# Patient Record
Sex: Male | Born: 1945 | Race: Black or African American | Hispanic: No | Marital: Married | Smoking: Current every day smoker
Health system: Southern US, Community
[De-identification: ages and names within clinical notes are randomized; demographics above are authoritative.]

## PROBLEM LIST (undated history)

## (undated) DIAGNOSIS — R972 Elevated prostate specific antigen [PSA]: Secondary | ICD-10-CM

## (undated) DIAGNOSIS — A048 Other specified bacterial intestinal infections: Secondary | ICD-10-CM

## (undated) DIAGNOSIS — N400 Enlarged prostate without lower urinary tract symptoms: Secondary | ICD-10-CM

## (undated) DIAGNOSIS — R51 Headache: Secondary | ICD-10-CM

## (undated) DIAGNOSIS — Z9889 Other specified postprocedural states: Secondary | ICD-10-CM

## (undated) DIAGNOSIS — I10 Essential (primary) hypertension: Secondary | ICD-10-CM

## (undated) DIAGNOSIS — K219 Gastro-esophageal reflux disease without esophagitis: Secondary | ICD-10-CM

## (undated) DIAGNOSIS — J449 Chronic obstructive pulmonary disease, unspecified: Secondary | ICD-10-CM

## (undated) DIAGNOSIS — R519 Headache, unspecified: Secondary | ICD-10-CM

## (undated) DIAGNOSIS — M7150 Other bursitis, not elsewhere classified, unspecified site: Secondary | ICD-10-CM

## (undated) DIAGNOSIS — E079 Disorder of thyroid, unspecified: Secondary | ICD-10-CM

## (undated) HISTORY — DX: Chronic obstructive pulmonary disease, unspecified: J44.9

## (undated) HISTORY — DX: Other bursitis, not elsewhere classified, unspecified site: M71.50

## (undated) HISTORY — DX: Elevated prostate specific antigen (PSA): R97.20

## (undated) HISTORY — DX: Other specified postprocedural states: Z98.890

## (undated) HISTORY — DX: Gastro-esophageal reflux disease without esophagitis: K21.9

## (undated) HISTORY — DX: Essential (primary) hypertension: I10

## (undated) HISTORY — DX: Headache, unspecified: R51.9

## (undated) HISTORY — DX: Disorder of thyroid, unspecified: E07.9

## (undated) HISTORY — DX: Benign prostatic hyperplasia without lower urinary tract symptoms: N40.0

## (undated) HISTORY — DX: Other specified bacterial intestinal infections: A04.8

## (undated) HISTORY — DX: Headache: R51

## (undated) HISTORY — PX: HERNIA REPAIR: SHX51

---

## 2001-09-15 LAB — HM SIGMOIDOSCOPY

## 2003-06-29 ENCOUNTER — Encounter: Admission: RE | Admit: 2003-06-29 | Discharge: 2003-06-29 | Payer: Self-pay | Admitting: Family Medicine

## 2003-06-29 ENCOUNTER — Encounter: Payer: Self-pay | Admitting: Family Medicine

## 2004-10-10 ENCOUNTER — Ambulatory Visit: Payer: Self-pay | Admitting: Family Medicine

## 2004-10-17 ENCOUNTER — Ambulatory Visit: Payer: Self-pay | Admitting: Family Medicine

## 2004-11-06 ENCOUNTER — Encounter: Admission: RE | Admit: 2004-11-06 | Discharge: 2004-11-06 | Payer: Self-pay | Admitting: Family Medicine

## 2004-11-14 ENCOUNTER — Ambulatory Visit: Payer: Self-pay | Admitting: Family Medicine

## 2005-11-21 ENCOUNTER — Ambulatory Visit: Payer: Self-pay | Admitting: Family Medicine

## 2005-12-10 IMAGING — CR DG CHEST 2V
2 series · 2 of 2 positions shown · non-contrast
Comparison: 06/29/03.

CLINICAL DATA: COPD/smoker. 
 CHEST - TWO VIEW:

[view not recorded (1 of 2)]
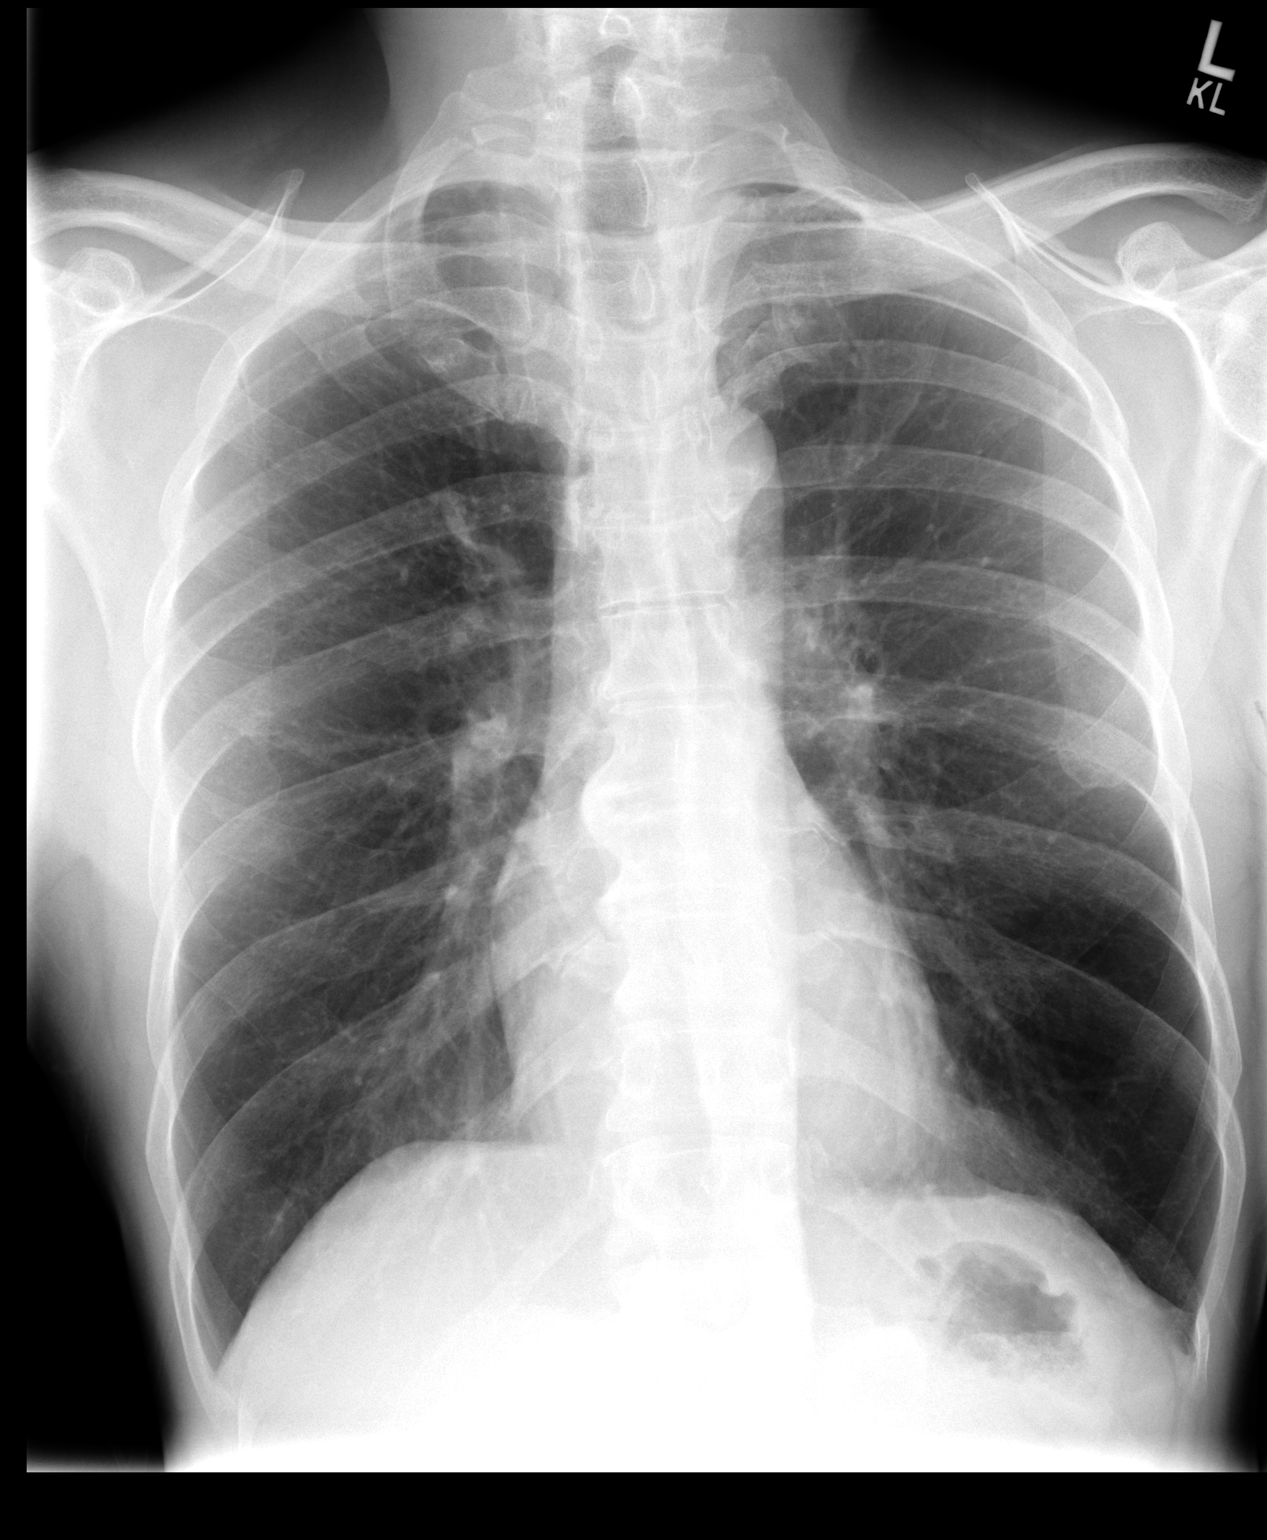

[view not recorded (2 of 2)]
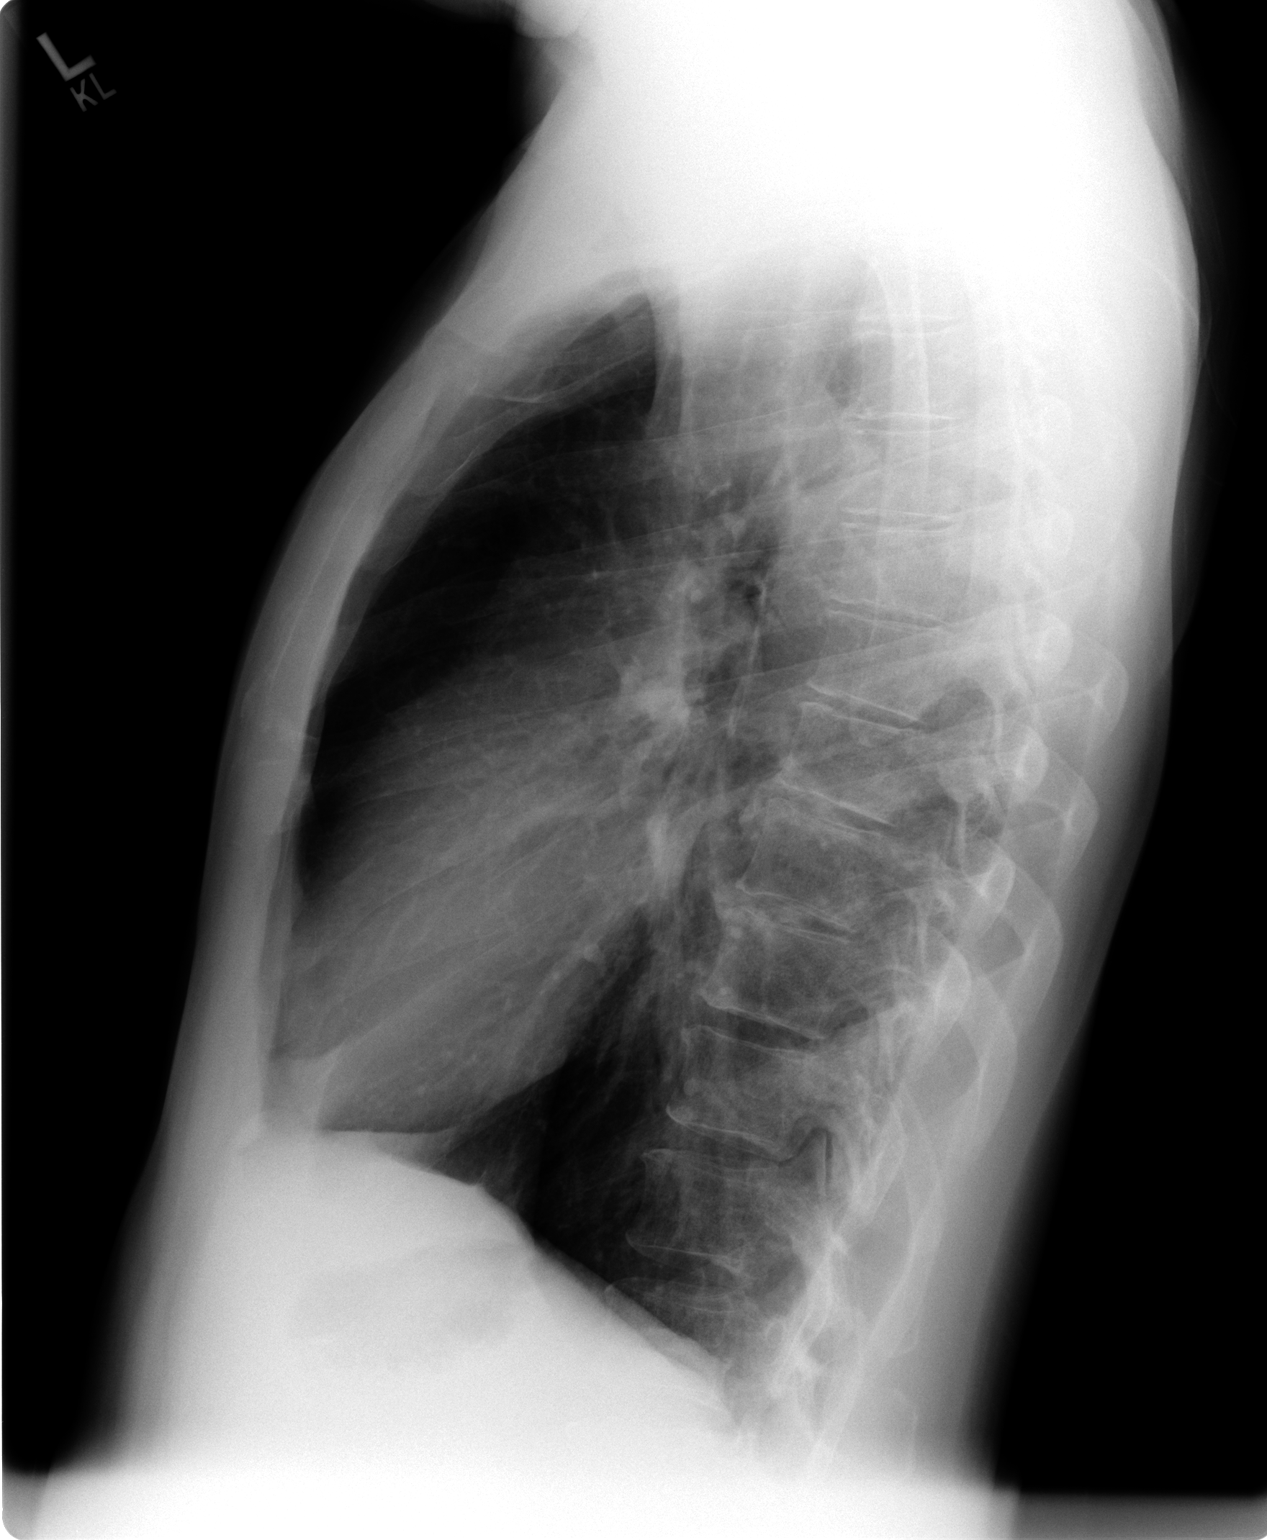

[2 of 2 positions shown; findings below may reference images not displayed]

FINDINGS: Moderate COPD.  No acute process.  No pleural fluid or osseous lesions.
IMPRESSION: COPD ? no active disease.

## 2006-01-27 ENCOUNTER — Ambulatory Visit: Payer: Self-pay | Admitting: Family Medicine

## 2006-02-03 ENCOUNTER — Ambulatory Visit: Payer: Self-pay | Admitting: Family Medicine

## 2007-04-07 DIAGNOSIS — J449 Chronic obstructive pulmonary disease, unspecified: Secondary | ICD-10-CM

## 2007-04-07 DIAGNOSIS — I10 Essential (primary) hypertension: Secondary | ICD-10-CM | POA: Insufficient documentation

## 2007-04-07 DIAGNOSIS — J4489 Other specified chronic obstructive pulmonary disease: Secondary | ICD-10-CM | POA: Insufficient documentation

## 2007-04-07 DIAGNOSIS — N400 Enlarged prostate without lower urinary tract symptoms: Secondary | ICD-10-CM

## 2012-03-26 ENCOUNTER — Ambulatory Visit (HOSPITAL_BASED_OUTPATIENT_CLINIC_OR_DEPARTMENT_OTHER)
Admission: RE | Admit: 2012-03-26 | Discharge: 2012-03-26 | Disposition: A | Discharge status: Home / Self Care | Payer: 59 | Source: Ambulatory Visit | Attending: Family Medicine | Admitting: Family Medicine

## 2012-03-26 ENCOUNTER — Other Ambulatory Visit (HOSPITAL_BASED_OUTPATIENT_CLINIC_OR_DEPARTMENT_OTHER): Payer: Self-pay | Admitting: Family Medicine

## 2012-03-26 DIAGNOSIS — E785 Hyperlipidemia, unspecified: Secondary | ICD-10-CM

## 2012-03-26 DIAGNOSIS — E559 Vitamin D deficiency, unspecified: Secondary | ICD-10-CM

## 2012-03-26 DIAGNOSIS — M2559 Pain in other specified joint: Secondary | ICD-10-CM

## 2012-03-26 DIAGNOSIS — R5381 Other malaise: Secondary | ICD-10-CM

## 2012-03-26 DIAGNOSIS — I10 Essential (primary) hypertension: Secondary | ICD-10-CM | POA: Insufficient documentation

## 2012-03-26 DIAGNOSIS — K219 Gastro-esophageal reflux disease without esophagitis: Secondary | ICD-10-CM

## 2012-03-26 DIAGNOSIS — R5383 Other fatigue: Secondary | ICD-10-CM

## 2012-03-26 DIAGNOSIS — R069 Unspecified abnormalities of breathing: Secondary | ICD-10-CM | POA: Insufficient documentation

## 2012-03-26 DIAGNOSIS — F17200 Nicotine dependence, unspecified, uncomplicated: Secondary | ICD-10-CM

## 2013-03-30 ENCOUNTER — Ambulatory Visit: Payer: 59 | Admitting: Family Medicine

## 2013-03-30 ENCOUNTER — Telehealth: Payer: Self-pay

## 2013-03-30 ENCOUNTER — Ambulatory Visit (INDEPENDENT_AMBULATORY_CARE_PROVIDER_SITE_OTHER): Payer: 59 | Admitting: Family Medicine

## 2013-03-30 VITALS — BP 149/75 | HR 92 | Wt 134.0 lb

## 2013-03-30 DIAGNOSIS — E0590 Thyrotoxicosis, unspecified without thyrotoxic crisis or storm: Secondary | ICD-10-CM

## 2013-03-30 DIAGNOSIS — I10 Essential (primary) hypertension: Secondary | ICD-10-CM

## 2013-03-30 NOTE — Telephone Encounter (Signed)
Antonio Jefferson came back into the office @ 11:57 today for feedback on his appointment with [facility].  I informed him of the dates and time below and I also gave him the number again to call if anything changes. The patient verbalized understanding and the importance of this test. He did not have any other questions at this time. LB

## 2013-03-30 NOTE — Progress Notes (Signed)
  Subjective:    Patient ID: Antonio Jefferson, male    DOB: [birth year], 67 y.o.   MRN: [id number]  HPI  Antonio Jefferson is here today to follow up on his abnormal thyroid labs.  He recently saw his urologist (He recently saw his Urologist, Dr. Marcelyn Bruins and he was told that his thyroid levels were concerning.  He was instructed to follow up with our office.    Review of Systems  Constitutional:       Weight loss    Past Medical History  Diagnosis Date  . Esophageal reflux   . Helicobacter pylori (H. pylori)   . Unspecified disorder of thyroid   . Other bursitis disorders   . Essential hypertension, benign   . BPH (benign prostatic hypertrophy)   . COPD (chronic obstructive pulmonary disease)   . Headache   . Elevated PSA   . History of prostate biopsy     Family History  Problem Relation Age of Onset  . Diabetes Mother   . Hypertension Mother   . Cancer Father     Prostate Cancer    History   Social History Narrative   Marital Status: Married Data processing manager)    Children:  Daughters  (3) (Terri, [place], Nia) Son (1)  Isidoro Donning)    Pets: Dogs (2) Turtle (1) Charity fundraiser (1)    Living Situation: Lives with wife    Occupation: Clinical biochemist Administrator)     Education: Engineer, maintenance (IT) (Biology)     Tobacco Use/Exposure:  Current Smoker - 3/4 ppd for 30 years.    Alcohol Use:  Beer (quart) daily    Drug Use:  None   Diet:  Regular   Exercise:  Walking at work    Hobbies: Fishing                 Objective:   Physical Exam  Constitutional: He appears well-nourished. No distress.  HENT:  Head: Normocephalic.  Nose: Nose normal.  Mouth/Throat: Oropharynx is clear and moist.  Eyes: Conjunctivae are normal. No scleral icterus.  Neck: Neck supple. No thyromegaly present.  Cardiovascular: Normal rate, regular rhythm and normal heart sounds.   Pulmonary/Chest: Effort normal and breath sounds normal.  Abdominal: Soft. He exhibits no mass. There is no tenderness.  Musculoskeletal: Normal  range of motion.  Lymphadenopathy:    He has no cervical adenopathy.  Neurological: He is alert.  Skin: Skin is warm and dry. No rash noted.  Psychiatric: He has a normal mood and affect. His behavior is normal. Judgment and thought content normal.          Assessment & Plan:

## 2013-03-30 NOTE — Telephone Encounter (Signed)
A call was placed to the insurance company for prior authorization. They stated that there was no need for a prior authorization with this procedure.   I called [facility] to schedule the appointment for Antonio Jefferson.  He is scheduled for Monday and Tuesday 7/21 and 7/22 at 9:00 am.  The patient has been notified via voice mail, that this is a two day process and if he's not able to keep that appointment he can call them at [phone] to reschedule. LB

## 2013-04-06 ENCOUNTER — Encounter: Payer: Self-pay | Admitting: Family Medicine

## 2013-04-06 ENCOUNTER — Ambulatory Visit (INDEPENDENT_AMBULATORY_CARE_PROVIDER_SITE_OTHER): Payer: 59 | Admitting: Family Medicine

## 2013-04-06 VITALS — BP 143/78 | HR 102 | Resp 16 | Ht 68.0 in | Wt 134.0 lb

## 2013-04-06 DIAGNOSIS — I10 Essential (primary) hypertension: Secondary | ICD-10-CM

## 2013-04-06 DIAGNOSIS — E0590 Thyrotoxicosis, unspecified without thyrotoxic crisis or storm: Secondary | ICD-10-CM

## 2013-04-06 MED ORDER — PROPRANOLOL HCL 20 MG PO TABS: 20.0000 mg | ORAL_TABLET | Freq: Two times a day (BID) | ORAL | Status: DC

## 2013-04-06 NOTE — Patient Instructions (Addendum)
1)  Tachycardia (102) /Hypertension - Start on the propranolol 20 mg at night for 4 days then increase to 1 tab twice a day.  You can stay on this until your procedure or longer if needed to keep your BP under better control.   2)  Hyperthyroidism - Let us know what you decide. We can get you to an endocrinologist if you want to discuss your options or can send you for the radioactive iodine treatment.      Hyperthyroidism The thyroid is a large gland located in the lower front part of your neck. The thyroid helps control metabolism. Metabolism is how your body uses food. It controls metabolism with the hormone thyroxine. When the thyroid is overactive, it produces too much hormone. When this happens, these following problems may occur:   Nervousness  Heat intolerance  Weight loss (in spite of increase food intake)  Diarrhea  Change in hair or skin texture  Palpitations (heart skipping or having extra beats)  Tachycardia (rapid heart rate)  Loss of menstruation (amenorrhea)  Shaking of the hands CAUSES  Grave's Disease (the immune system attacks the thyroid gland). This is the most common cause.  Inflammation of the thyroid gland.  Tumor (usually benign) in the thyroid gland or elsewhere.  Excessive use of thyroid medications (both prescription and 'natural').  Excessive ingestion of Iodine. DIAGNOSIS  To prove hyperthyroidism, your caregiver may do blood tests and ultrasound tests. Sometimes the signs are hidden. It may be necessary for your caregiver to watch this illness with blood tests, either before or after diagnosis and treatment. TREATMENT Short-term treatment There are several treatments to control symptoms. Drugs called beta blockers may give some relief. Drugs that decrease hormone production will provide temporary relief in many people. These measures will usually not give permanent relief. Definitive therapy There are treatments available which can be  discussed between you and your caregiver which will permanently treat the problem. These treatments range from surgery (removal of the thyroid), to the use of radioactive iodine (destroys the thyroid by radiation), to the use of antithyroid drugs (interfere with hormone synthesis). The first two treatments are permanent and usually successful. They most often require hormone replacement therapy for life. This is because it is impossible to remove or destroy the exact amount of thyroid required to make a person euthyroid (normal). HOME CARE INSTRUCTIONS  See your caregiver if the problems you are being treated for get worse. Examples of this would be the problems listed above. SEEK MEDICAL CARE IF: Your general condition worsens. MAKE SURE YOU:   Understand these instructions.  Will watch your condition.  Will get help right away if you are not doing well or get worse. Document Released: 09/01/2005 Document Revised: 11/24/2011 Document Reviewed: 01/13/2007 [facility] Patient Information 2014 [place], [place].  Radioiodine (I-131) Therapy for Hyperthyroidism Radioiodine (I-131) therapy for hyperthyroidism is a procedure used to treat an overactive thyroid (hyperthyroidism). The patient swallows I-131, which is a radioactive form of iodine. The I-131 destroys thyroid cells. The thyroid is a gland in the neck. It makes thyroid hormones, which control how cells throughout the body use energy. Hyperthyroidism is usually caused by Grave's disease or by growths within the thyroid (nodules). Symptoms may include:  Nervousness.  Irritability.  Problems with sleep.  Tiredness.  Fast heart rate.  Shaky hands.  Sweating.  Heat sensitivity.  Unintended weight loss.  Brittle hair.  Enlarged thyroid gland.  Menstrual changes.  Frequent stools. LET YOUR CAREGIVER KNOW ABOUT:  All allergies.  All medications that you are taking, including over-the-counter and prescription drugs, dietary  supplements, vitamins, or herbal preparations.  Any previous complications from this or other procedures.  Smoking history.  Possibility of pregnancy.  History of bleeding problems.  Any other health problems. RISKS AND COMPLICATIONS  Risks of the procedure include:  Slight pain in the area of the thyroid gland. BEFORE THE PROCEDURE  If you are a woman, you may be asked to have pregnancy testing before the procedure.  If you have been taking thyroid medicines, you will usually be asked to stop them three days before your procedure.  You will usually be asked to stop eating and drinking at midnight the day of your procedure.  You will need to plan for someone to drive you home after the procedure. PROCEDURE  You will be asked to swallow the radioactive iodine in either pill or liquid form. It can take 1-3 months for the treatment to work. The treatment is most effective at about 3-6 months after the dose of iodine has been given. In most people, a single dose of radioactive iodine resolves their hyperthyroidism, but a few people will require a second dose.  AFTER THE PROCEDURE  Because you will be giving off tiny amounts of radiation for several days, there are some special precautions you will be asked to follow for about 2-4 days after the procedure:  Avoid being around babies or pregnant women.  Do not use public bathrooms.  Flush twice after using the toilet.  Take a bath or shower every day.  Practice good hand washing.  Drink fluids normally.  Use disposable utensils, or clean your utensils separately from those of others.  Sleep alone.  Do not engage in intimate contact.  Wash your sheets, towels, and clothes each day, by themselves.  Do not make food for other people. Other precautions include:  Stopping breastfeeding.  Do not attempt to become pregnant for at least a year after you have had the procedure.  When traveling, bring a letter of explanation  from your caregiver for three months. Radioactivity may trip detectors in airports or other places. Because the point of the procedure is to destroy your thyroid gland, you will need to take thyroid hormone by mouth for the rest of your life. HOME CARE INSTRUCTIONS   Ask your caregiver when you should resume or start thyroid medications.  Take all medications exactly as directed.  Follow any prescribed diet.  Follow instructions regarding both rest and physical activity. SEEK IMMEDIATE MEDICAL CARE IF:  You have a dry mouth.  You have a sore throat.  You have neck pain.  You have a tight sensation in the throat.  You have nausea and vomiting.  You are fatigued.  You have flushing.  You have bowel changes (either diarrhea or constipation). Document Released: 01/18/2009 Document Revised: 11/24/2011 Document Reviewed: 01/18/2009 [facility] Patient Information 2014 [place], [place].

## 2013-04-06 NOTE — Progress Notes (Signed)
  Subjective:    Patient ID: Antonio Jefferson, male    DOB: 1946/09/10, 67 y.o.   MRN: [id number]  HPI  Antonio Jefferson is here today to go over his thyroid uptake scan. He has been doing well since his last visit. His blood pressure remains elevated. He is still taking Cardura 8 mg. His urologist wants him to have his thyroid addressed before he proceeds with his surgery.     Review of Systems  Constitutional: Negative.  Negative for diaphoresis and unexpected weight change.  HENT: Negative.  Negative for trouble swallowing.   Eyes: Negative.   Respiratory: Negative.   Cardiovascular: Negative for palpitations.  Gastrointestinal: Negative.   Endocrine: Negative.   Genitourinary: Positive for difficulty urinating.  Musculoskeletal: Negative.   Skin: Negative.   Allergic/Immunologic: Negative.   Neurological: Positive for tremors.  Hematological: Negative.   Psychiatric/Behavioral: Negative.     Past Medical History  Diagnosis Date  . Esophageal reflux   . Helicobacter pylori (H. pylori)   . Unspecified disorder of thyroid   . Other bursitis disorders   . Essential hypertension, benign   . BPH (benign prostatic hypertrophy)   . COPD (chronic obstructive pulmonary disease)   . Headache   . Elevated PSA   . History of prostate biopsy     Family History  Problem Relation Age of Onset  . Diabetes Mother   . Hypertension Mother   . Cancer Father     Prostate Cancer    History   Social History Narrative   Marital Status: Married Data processing manager)    Children:  Daughters  (3) (Terri, [place], Nia) Son (1)  Isidoro Donning)    Pets: Dogs (2) Turtle (1) Charity fundraiser (1)    Living Situation: Lives with wife    Occupation: Clinical biochemist Administrator)     Education: Engineer, maintenance (IT) (Biology)     Tobacco Use/Exposure:  Current Smoker - 3/4 ppd for 30 years.    Alcohol Use:  Beer (quart) daily    Drug Use:  None   Diet:  Regular   Exercise:  Walking at work    Hobbies: Fishing                Objective:    Physical Exam  Vitals reviewed. Constitutional: He is oriented to person, place, and time. He appears well-nourished. No distress.  Neck: No thyromegaly present.  Cardiovascular: Regular rhythm.  Tachycardia present.   Pulmonary/Chest: Effort normal and breath sounds normal. No respiratory distress.  Neurological: He is alert and oriented to person, place, and time.  Skin: Skin is warm and dry. He is not diaphoretic. No erythema.  Psychiatric: He has a normal mood and affect. His behavior is normal. Judgment and thought content normal.          Assessment & Plan:

## 2013-04-15 ENCOUNTER — Encounter: Payer: Self-pay | Admitting: *Deleted

## 2013-04-29 IMAGING — CR DG CHEST 2V
2 series · 2 of 2 positions shown · non-contrast
Comparison: 11/06/2004.

CLINICAL DATA: 65-year-old male with hypertension, emphysema,
respiratory abnormality not otherwise specified.

CHEST - 2 VIEW

[w chest lat]
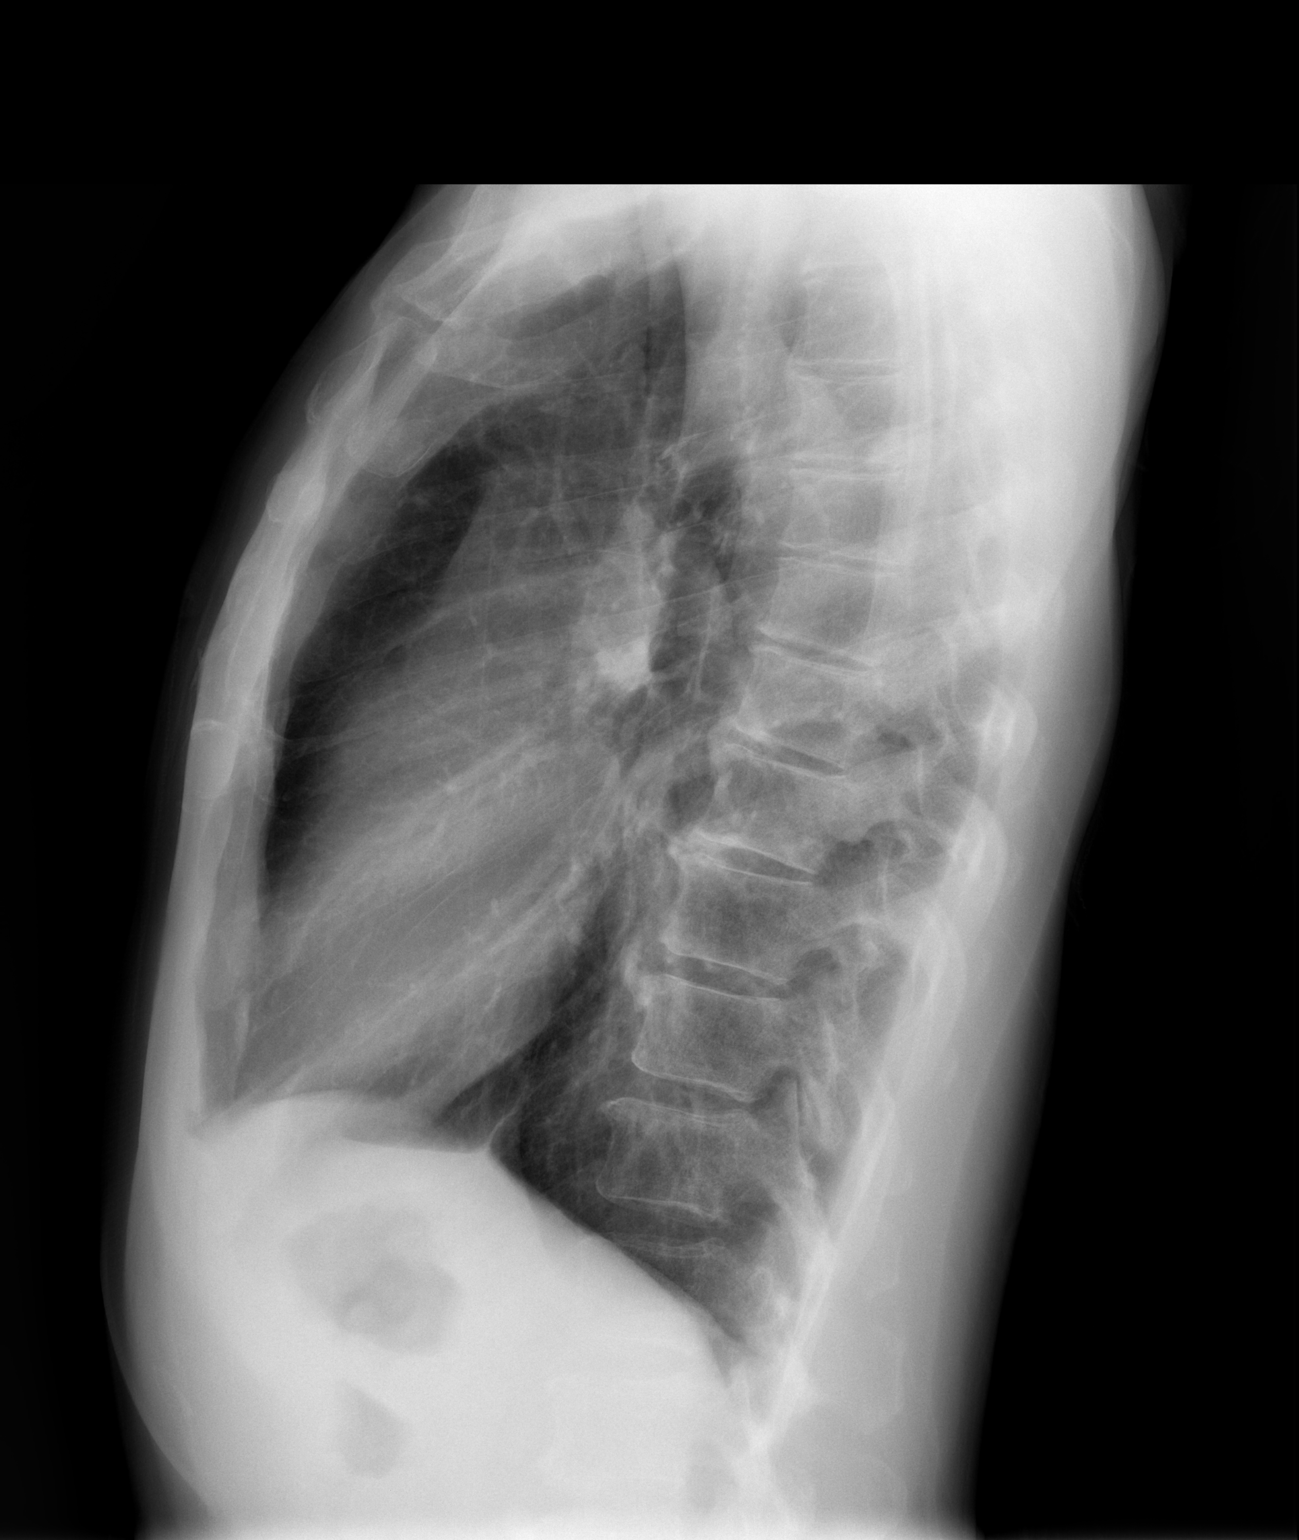

[w chest pa]
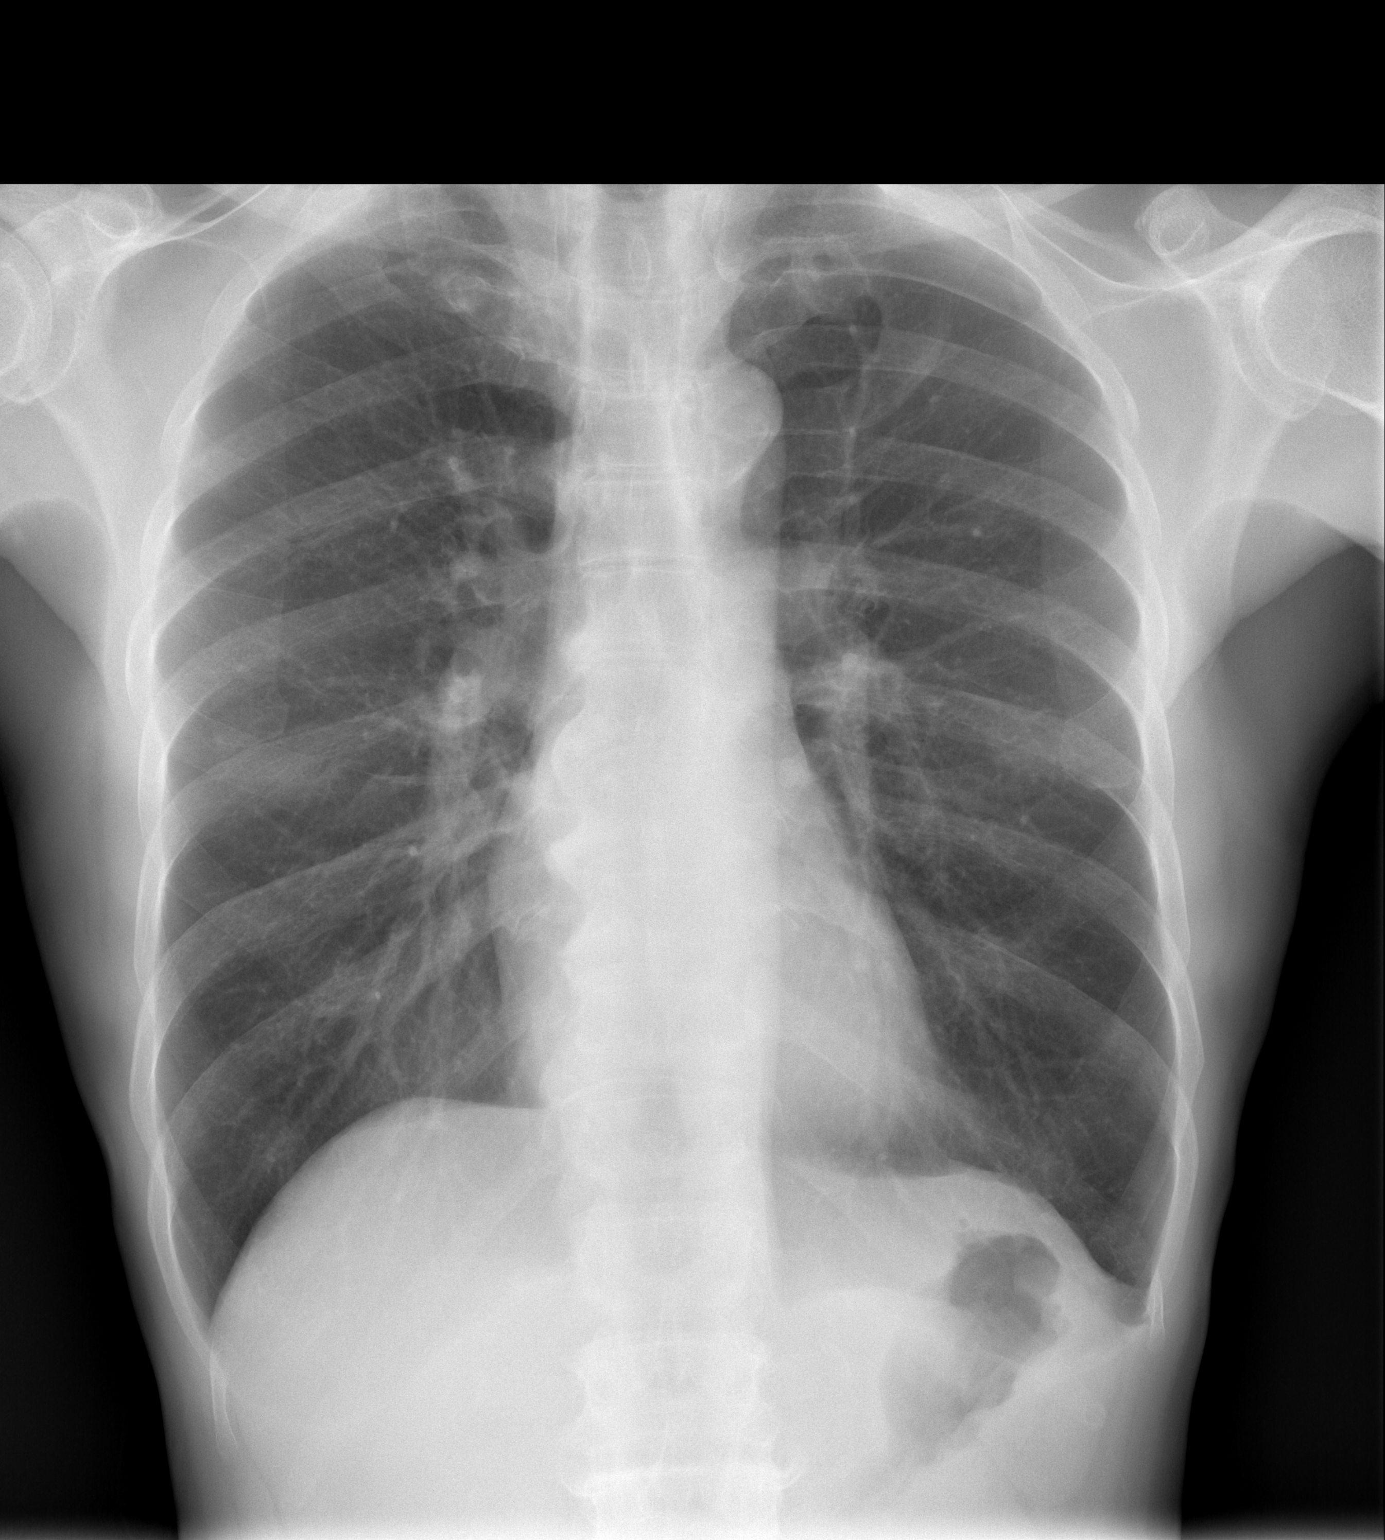

[2 of 2 positions shown; findings below may reference images not displayed]

FINDINGS: Chronically large lung volumes.  Cardiac size and
mediastinal contours are within normal limits.  Visualized tracheal
air column is within normal limits.  Stable lung markings.  No
pneumothorax, pulmonary edema, pleural effusion or confluent
pulmonary opacity. No acute osseous abnormality identified.
IMPRESSION: Chronic hyperinflation, no acute cardiopulmonary abnormality.

## 2013-05-07 ENCOUNTER — Encounter: Payer: Self-pay | Admitting: Family Medicine

## 2013-05-07 DIAGNOSIS — I10 Essential (primary) hypertension: Secondary | ICD-10-CM | POA: Insufficient documentation

## 2013-05-07 DIAGNOSIS — E0590 Thyrotoxicosis, unspecified without thyrotoxic crisis or storm: Secondary | ICD-10-CM | POA: Insufficient documentation

## 2013-05-07 NOTE — Assessment & Plan Note (Addendum)
Antonio Jefferson thyroid has been elevated since last year.  He discussed his thyroid labs at that time and he was not interested in following up at that time.  We will send him for a thyroid scan.

## 2013-05-07 NOTE — Assessment & Plan Note (Signed)
His BP is elevated.  He is to pay closer attention to his sodium intake.

## 2013-05-21 NOTE — Assessment & Plan Note (Signed)
He will start on propranolol which will lower his pressure.

## 2013-05-21 NOTE — Assessment & Plan Note (Signed)
His thyroid scan is consistent with Grave's Disease.  He is going to decide what he wants to do with his thyroid.

## 2013-09-26 ENCOUNTER — Other Ambulatory Visit: Payer: Self-pay | Admitting: Family Medicine

## 2013-10-28 ENCOUNTER — Other Ambulatory Visit: Payer: Self-pay | Admitting: Family Medicine

## 2013-11-21 ENCOUNTER — Ambulatory Visit (INDEPENDENT_AMBULATORY_CARE_PROVIDER_SITE_OTHER): Payer: 59 | Admitting: Family Medicine

## 2013-11-21 ENCOUNTER — Telehealth: Payer: Self-pay | Admitting: *Deleted

## 2013-11-21 ENCOUNTER — Encounter (INDEPENDENT_AMBULATORY_CARE_PROVIDER_SITE_OTHER): Payer: Self-pay

## 2013-11-21 ENCOUNTER — Encounter: Payer: Self-pay | Admitting: Family Medicine

## 2013-11-21 VITALS — BP 168/92 | HR 76 | Resp 16 | Wt 144.0 lb

## 2013-11-21 DIAGNOSIS — M67919 Unspecified disorder of synovium and tendon, unspecified shoulder: Secondary | ICD-10-CM

## 2013-11-21 DIAGNOSIS — I781 Nevus, non-neoplastic: Secondary | ICD-10-CM

## 2013-11-21 DIAGNOSIS — M7552 Bursitis of left shoulder: Secondary | ICD-10-CM

## 2013-11-21 DIAGNOSIS — M719 Bursopathy, unspecified: Secondary | ICD-10-CM

## 2013-11-21 DIAGNOSIS — I10 Essential (primary) hypertension: Secondary | ICD-10-CM

## 2013-11-21 MED ORDER — PROPRANOLOL HCL 20 MG PO TABS: 20.0000 mg | ORAL_TABLET | Freq: Two times a day (BID) | ORAL | Status: AC

## 2013-11-21 MED ORDER — HYDROCHLOROTHIAZIDE 25 MG PO TABS
ORAL_TABLET | ORAL | Status: AC
Start: 2013-11-21 — End: 2014-11-22

## 2013-11-21 MED ORDER — AMLODIPINE BESYLATE 5 MG PO TABS: 5.0000 mg | ORAL_TABLET | Freq: Every day | ORAL | Status: AC

## 2013-11-21 NOTE — Patient Instructions (Addendum)
1)  Hypertension - We are adding another BP medication called amlodipine 5 mg per day to get your pressure <140/<90.  If this new addition does not bring you all the way down and/or if you develop swelling in your ankles, you may want to add some HCTZ 12.5 - 25 mg in the morning.  Limit your sodium to not more than 3000 mg per day.  Decrease your alcohol to not more than 1 serving per day and STOP SMOKING.     3 to 4 Gram Sodium Diet, No Added Salt (NAS) A 3 to 4 gram sodium diet restricts the amount of sodium in the diet to no more than 3 to 4 g or 3000 to 4000 mg daily. Limiting the amount of sodium is often used to help lower blood pressure. It is important if you have heart, liver, or kidney problems. Many foods contain sodium for flavor and sometimes as a preservative. When the amount of sodium in a diet needs to be low, it is important to know what to look for when choosing foods and drinks. The following includes some information and guidelines to help make it easier for you to adapt to a low sodium diet. QUICK TIPS  Do not add salt to food.  Avoid convenience items and fast food.  Choose unsalted snack foods.  Buy lower sodium products, often labeled as "lower sodium" or "no salt added."  Check food labels to learn how much sodium is in 1 serving.  When eating at a restaurant, ask that your food be prepared with less salt or none, if possible. READING FOOD LABELS FOR SODIUM INFORMATION The nutrition facts label is a good place to find how much sodium is in foods. Look for products with no more than 500 to 600 mg of sodium per meal and no more than 150 mg per serving. Remember that 3 to 4 g = 3000 to 4000 mg. The food label may also list foods as:  Sodium-free: Less than 5 mg in a serving.  Very low sodium: 35 mg or less in a serving.  Low-sodium: 140 mg or less in a serving.  Light in sodium: 50% less sodium in a serving. For example, if a food that usually has 300 mg of sodium  is changed to become light in sodium, it will have 150 mg of sodium.  Reduced sodium: 25% less sodium in a serving. For example, if a food that usually has 400 mg of sodium is changed to reduced sodium, it will have 300 mg of sodium. CHOOSING FOODS Grains  Avoid: Salted crackers and snack items. Bread stuffing and biscuit mixes. Seasoned rice or pasta mixes.  Choose: Unsalted snack items. English muffins, breads, and rolls. Homemade pancakes and waffles. Most cereals. Pasta. Meats  Avoid: Salted, canned, smoked, spiced, pickled meats, including fish and poultry. Bacon, ham, sausage, cold cuts, hot dogs, anchovies.  Choose: Low-sodium canned tuna and salmon. Fresh or frozen meat, poultry, and fish. Dairy  Avoid: Processed cheese and spreads. Cottage cheese. Buttermilk and condensed milk. Regular cheese.  Choose: Milk. Low-sodium cottage cheese. Yogurt. Sour cream. Low-sodium cheese. Fruits and Vegetables  Avoid: Regular canned vegetables. Regular canned tomato sauce and paste. Frozen vegetables in sauces. Olives. Rosita FirePickles. Relishes. Sauerkraut.  Choose: Low-sodium canned vegetables. Low-sodium tomato sauce and paste. Frozen or fresh vegetables. Fresh and frozen fruit. Condiments  Avoid: Canned and packaged gravies. Worcestershire sauce. Tartar sauce. Barbecue sauce. Soy sauce. Steak sauce. Ketchup. Onion, garlic, and table salt. Meat  flavorings and tenderizers.  Choose: Fresh and dried herbs and spices. Low-sodium varieties of mustard and ketchup. Lemon juice. Tabasco sauce. Horseradish. SAMPLE 3 TO 4 GRAM SODIUM MEAL PLAN  Breakfast / Sodium (mg)  1 cup low-fat milk / 143 mg  2 slices whole-wheat toast / 270 mg  1 tbs heart-healthy margarine / 153 mg  1 hard-boiled egg / 139 mg Lunch / Sodium (mg)  1 cup raw carrots / 76 mg   cup hummus / 298 mg  1 cup low-fat milk / 143 mg   cup red grapes / 2 mg  1 cup low-sodium chicken and rice soup / 480 mg  10 low-sodium  saltine crackers / 191 mg Dinner / Sodium (mg)  1 cup whole-wheat pasta / 2 mg  1 cup tomato sauce / 1178 mg  3 oz lean ground beef / 57 mg  1 small side salad (1 cup raw spinach leaves,  cup cucumber,  cup yellow bell pepper) / 25 mg  1 tsp ranch dressing / 144 mg Snack / Sodium (mg)  1 slice cheddar cheese / 258 mg  1 medium apple / 1 mg Nutrient Analysis  Calories: 2005  Protein: 85 g  Carbohydrate: 245 g  Fat: 78 g  Sodium: 3560 mg Document Released: 09/01/2005 Document Revised: 11/24/2011 Document Reviewed: 12/03/2009 [employer] Patient Information 2014 [place], [place].

## 2013-11-21 NOTE — Telephone Encounter (Signed)
Antonio Jefferson has an appointment with Dr Ranell PatrickNorris on 12/01/13 @ 8:00. I left Antonio Jefferson a voice mail with this information.-eh

## 2013-11-21 NOTE — Progress Notes (Signed)
Subjective:    Patient ID: Antonio Jefferson, male    DOB: Apr 09, 1946, 68 y.o.   MRN: [id number]  HPI  Antonio Jefferson is here today to get a refill on his blood pressure medication.  He takes both Cardura and Propranolol.  He monitors his blood pressure regularly and says that it remains elevated.  He feels that his blood pressure remains high due to stress at work.     Review of Systems  Constitutional: Negative for fatigue and unexpected weight change.  HENT: Negative.   Respiratory: Negative for shortness of breath.   Cardiovascular: Negative for chest pain, palpitations and leg swelling.  Gastrointestinal: Negative.   Genitourinary: Negative.   Musculoskeletal: Negative for myalgias.  Skin: Negative.   Neurological: Negative.   Psychiatric/Behavioral: Positive for agitation (He is stressed.  ).    Past Medical History  Diagnosis Date  . Esophageal reflux   . Helicobacter pylori (H. pylori)   . Unspecified disorder of thyroid   . Other bursitis disorders   . Essential hypertension, benign   . BPH (benign prostatic hypertrophy)   . COPD (chronic obstructive pulmonary disease)   . Headache   . Elevated PSA   . History of prostate biopsy      Past Surgical History  Procedure Laterality Date  . 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    Alcohol Use:  Beer (quart) daily    Drug Use:  None   Diet:  Regular   Exercise:  Walking at work    Hobbies: Fishing               Family History  Problem Relation Age of Onset  . Diabetes Mother   . Hypertension Mother   . Cancer Father     Prostate Cancer     Current Outpatient  Prescriptions on File Prior to Visit  Medication Sig Dispense Refill  . doxazosin (CARDURA) 8 MG tablet Take 8 mg by mouth at bedtime.       No current facility-administered medications on file prior to visit.     No Known Allergies   Immunization History  Administered Date(s) Administered  . Influenza-Unspecified 07/16/2013  . Pneumococcal Polysaccharide-23 03/26/2012  . Zoster 04/30/2012       Objective:   Physical Exam  Constitutional: He is oriented to person, place, and time. He appears well-nourished. No distress.  HENT:  Head: Normocephalic.  Eyes: No scleral icterus.  Neck: Neck supple. No thyromegaly present.  Cardiovascular: Normal rate, regular rhythm and normal heart sounds.   Pulmonary/Chest: Effort normal and breath sounds normal.  Musculoskeletal: Normal range of motion. He exhibits no edema.  Neurological: He is alert and oriented to person, place, and time.  Skin: Skin is warm and dry. No rash noted.  Psychiatric: He has a normal mood and affect. His behavior is normal. Judgment and thought content normal.      Assessment & Plan:   Antonio Jefferson was seen today for medication management.  Diagnoses and associated orders for this visit:  Essential hypertension, benign Comments: His  BP remains too high so he is to add some amlodipine and HCTZ if needed.   - propranolol (INDERAL) 20 MG tablet; Take 1 tablet (20 mg total) by mouth 2 (two) times daily. - amLODipine (NORVASC) 5 MG tablet; Take 1 tablet (5 mg total) by mouth daily. - hydrochlorothiazide (HYDRODIURIL) 25 MG tablet; Take 1/2 - 1 tab po in the morning for BP/Swelling  Nevus, non-neoplastic Comments: He was given contact information for Dr. Junius Roads with [facility].    Bursitis of shoulder, left Comments: Referring to Dr. Malon Kindle  - Ambulatory referral to Orthopedic Surgery

## 2014-01-28 DIAGNOSIS — M7552 Bursitis of left shoulder: Secondary | ICD-10-CM | POA: Insufficient documentation

## 2014-01-28 DIAGNOSIS — I781 Nevus, non-neoplastic: Secondary | ICD-10-CM | POA: Insufficient documentation
